# Patient Record
Sex: Female | Born: 1950 | Race: Black or African American | Hispanic: No | Marital: Single | Smoking: Never smoker
Health system: Southern US, Community
[De-identification: ages and names within clinical notes are randomized; demographics above are authoritative.]

## PROBLEM LIST (undated history)

## (undated) DIAGNOSIS — H269 Unspecified cataract: Secondary | ICD-10-CM

## (undated) DIAGNOSIS — E0500 Thyrotoxicosis with diffuse goiter without thyrotoxic crisis or storm: Secondary | ICD-10-CM

## (undated) DIAGNOSIS — M1990 Unspecified osteoarthritis, unspecified site: Secondary | ICD-10-CM

## (undated) DIAGNOSIS — E785 Hyperlipidemia, unspecified: Secondary | ICD-10-CM

## (undated) DIAGNOSIS — E079 Disorder of thyroid, unspecified: Secondary | ICD-10-CM

## (undated) DIAGNOSIS — E7800 Pure hypercholesterolemia, unspecified: Secondary | ICD-10-CM

## (undated) DIAGNOSIS — I10 Essential (primary) hypertension: Secondary | ICD-10-CM

## (undated) HISTORY — DX: Thyrotoxicosis with diffuse goiter without thyrotoxic crisis or storm: E05.00

## (undated) HISTORY — DX: Essential (primary) hypertension: I10

## (undated) HISTORY — DX: Hyperlipidemia, unspecified: E78.5

## (undated) HISTORY — DX: Disorder of thyroid, unspecified: E07.9

## (undated) HISTORY — PX: ABDOMINAL HYSTERECTOMY: SHX81

## (undated) HISTORY — DX: Unspecified osteoarthritis, unspecified site: M19.90

## (undated) HISTORY — DX: Unspecified cataract: H26.9

## (undated) HISTORY — DX: Pure hypercholesterolemia, unspecified: E78.00

---

## 1997-12-26 ENCOUNTER — Emergency Department (HOSPITAL_COMMUNITY): Admission: EM | Admit: 1997-12-26 | Discharge: 1997-12-26 | Payer: Self-pay | Admitting: Emergency Medicine

## 1998-01-18 ENCOUNTER — Other Ambulatory Visit: Admission: RE | Admit: 1998-01-18 | Discharge: 1998-01-18 | Payer: Self-pay | Admitting: Gynecology

## 1998-06-24 ENCOUNTER — Ambulatory Visit (HOSPITAL_COMMUNITY): Admission: RE | Admit: 1998-06-24 | Discharge: 1998-06-24 | Payer: Self-pay | Admitting: Diagnostic Radiology

## 1998-06-24 ENCOUNTER — Encounter: Payer: Self-pay | Admitting: Diagnostic Radiology

## 1998-07-22 ENCOUNTER — Encounter: Payer: Self-pay | Admitting: Gynecology

## 1998-07-26 ENCOUNTER — Encounter (INDEPENDENT_AMBULATORY_CARE_PROVIDER_SITE_OTHER): Payer: Self-pay | Admitting: Specialist

## 1998-07-26 ENCOUNTER — Inpatient Hospital Stay (HOSPITAL_COMMUNITY): Admission: RE | Admit: 1998-07-26 | Discharge: 1998-07-29 | Payer: Self-pay | Admitting: Gynecology

## 1999-04-20 ENCOUNTER — Other Ambulatory Visit: Admission: RE | Admit: 1999-04-20 | Discharge: 1999-04-20 | Payer: Self-pay | Admitting: Gynecology

## 2000-06-09 ENCOUNTER — Other Ambulatory Visit: Admission: RE | Admit: 2000-06-09 | Discharge: 2000-06-09 | Payer: Self-pay | Admitting: Gynecology

## 2000-06-21 ENCOUNTER — Encounter: Payer: Self-pay | Admitting: Gynecology

## 2000-06-21 ENCOUNTER — Encounter: Admission: RE | Admit: 2000-06-21 | Discharge: 2000-06-21 | Payer: Self-pay | Admitting: Gynecology

## 2001-06-26 ENCOUNTER — Encounter: Admission: RE | Admit: 2001-06-26 | Discharge: 2001-06-26 | Payer: Self-pay | Admitting: Gynecology

## 2001-06-26 ENCOUNTER — Encounter: Payer: Self-pay | Admitting: Gynecology

## 2001-06-26 ENCOUNTER — Other Ambulatory Visit: Admission: RE | Admit: 2001-06-26 | Discharge: 2001-06-26 | Payer: Self-pay | Admitting: Gynecology

## 2002-06-30 ENCOUNTER — Other Ambulatory Visit: Admission: RE | Admit: 2002-06-30 | Discharge: 2002-06-30 | Payer: Self-pay | Admitting: Gynecology

## 2002-07-01 ENCOUNTER — Encounter: Admission: RE | Admit: 2002-07-01 | Discharge: 2002-07-01 | Payer: Self-pay | Admitting: Gynecology

## 2002-07-01 ENCOUNTER — Encounter: Payer: Self-pay | Admitting: Gynecology

## 2003-07-07 ENCOUNTER — Encounter: Admission: RE | Admit: 2003-07-07 | Discharge: 2003-07-07 | Payer: Self-pay | Admitting: Gynecology

## 2003-07-09 ENCOUNTER — Other Ambulatory Visit: Admission: RE | Admit: 2003-07-09 | Discharge: 2003-07-09 | Payer: Self-pay | Admitting: Gynecology

## 2004-07-08 ENCOUNTER — Ambulatory Visit (HOSPITAL_COMMUNITY): Admission: RE | Admit: 2004-07-08 | Discharge: 2004-07-08 | Payer: Self-pay | Admitting: Gynecology

## 2004-07-19 ENCOUNTER — Other Ambulatory Visit: Admission: RE | Admit: 2004-07-19 | Discharge: 2004-07-19 | Payer: Self-pay | Admitting: Gynecology

## 2005-07-14 ENCOUNTER — Ambulatory Visit (HOSPITAL_COMMUNITY): Admission: RE | Admit: 2005-07-14 | Discharge: 2005-07-14 | Payer: Self-pay | Admitting: Gynecology

## 2005-07-24 ENCOUNTER — Other Ambulatory Visit: Admission: RE | Admit: 2005-07-24 | Discharge: 2005-07-24 | Payer: Self-pay | Admitting: Gynecology

## 2006-07-10 ENCOUNTER — Encounter: Admission: RE | Admit: 2006-07-10 | Discharge: 2006-07-10 | Payer: Self-pay | Admitting: Orthopedic Surgery

## 2006-07-16 ENCOUNTER — Ambulatory Visit (HOSPITAL_COMMUNITY): Admission: RE | Admit: 2006-07-16 | Discharge: 2006-07-16 | Payer: Self-pay | Admitting: Gynecology

## 2006-08-13 ENCOUNTER — Other Ambulatory Visit: Admission: RE | Admit: 2006-08-13 | Discharge: 2006-08-13 | Payer: Self-pay | Admitting: Gynecology

## 2007-07-17 ENCOUNTER — Ambulatory Visit (HOSPITAL_COMMUNITY): Admission: RE | Admit: 2007-07-17 | Discharge: 2007-07-17 | Payer: Self-pay | Admitting: Gynecology

## 2008-06-11 IMAGING — MG MM DIGITAL SCREENING BILAT
4 series · 4 of 4 positions shown · non-contrast
Comparison: Prior studies.

DG SCREEN MAMMOGRAM BILATERAL
Bilateral CC and MLO view(s) were taken.
Prior study comparison: July 08, 2004, bilateral screening mammogram.

DIGITAL SCREENING MAMMOGRAM WITH CAD:

[R CC]
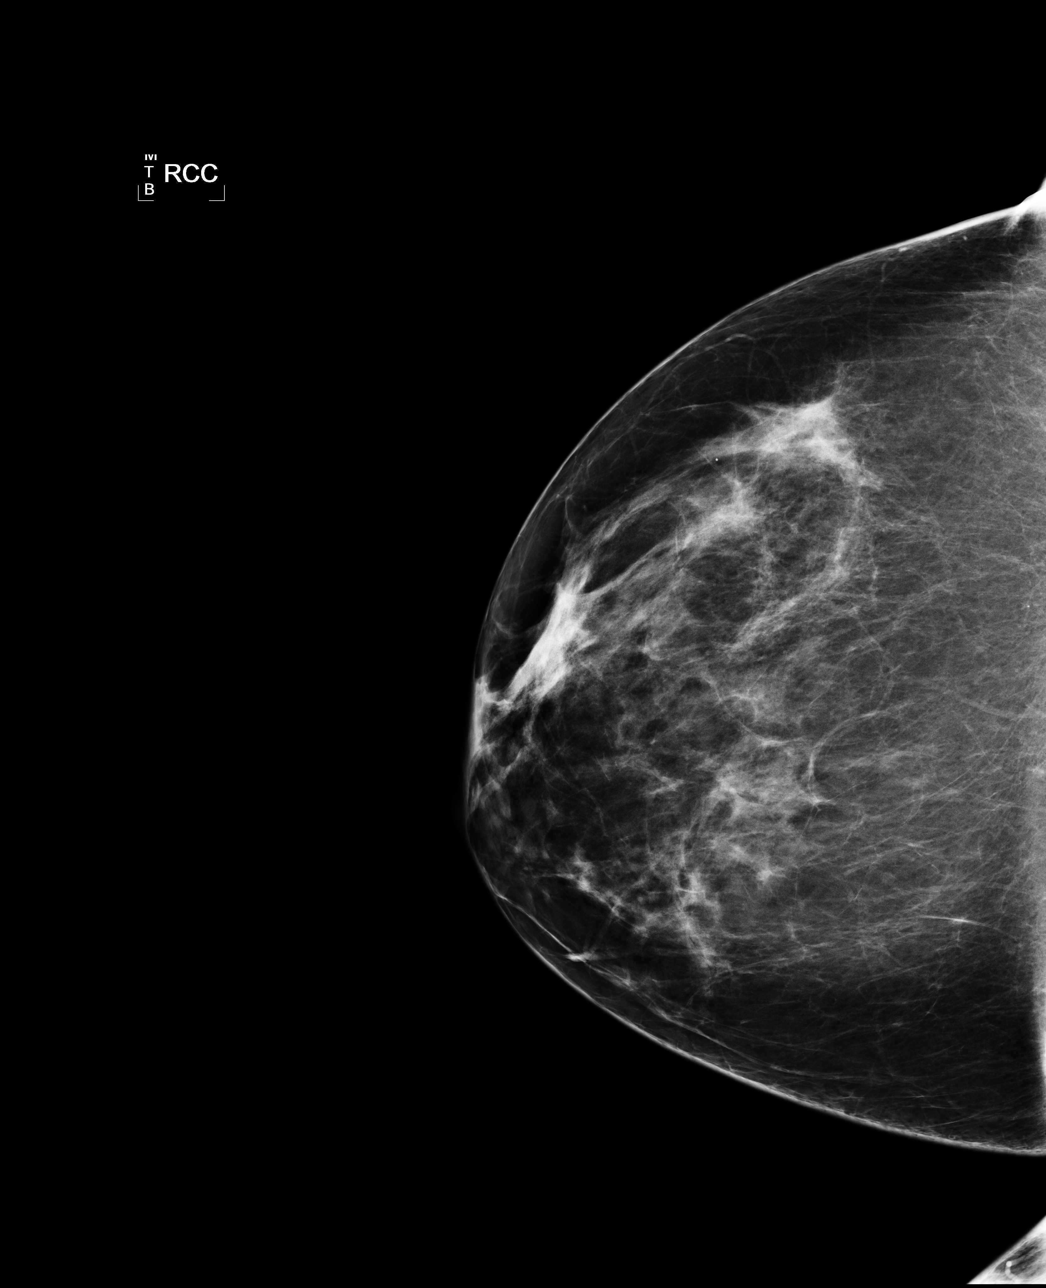

[R MLO]
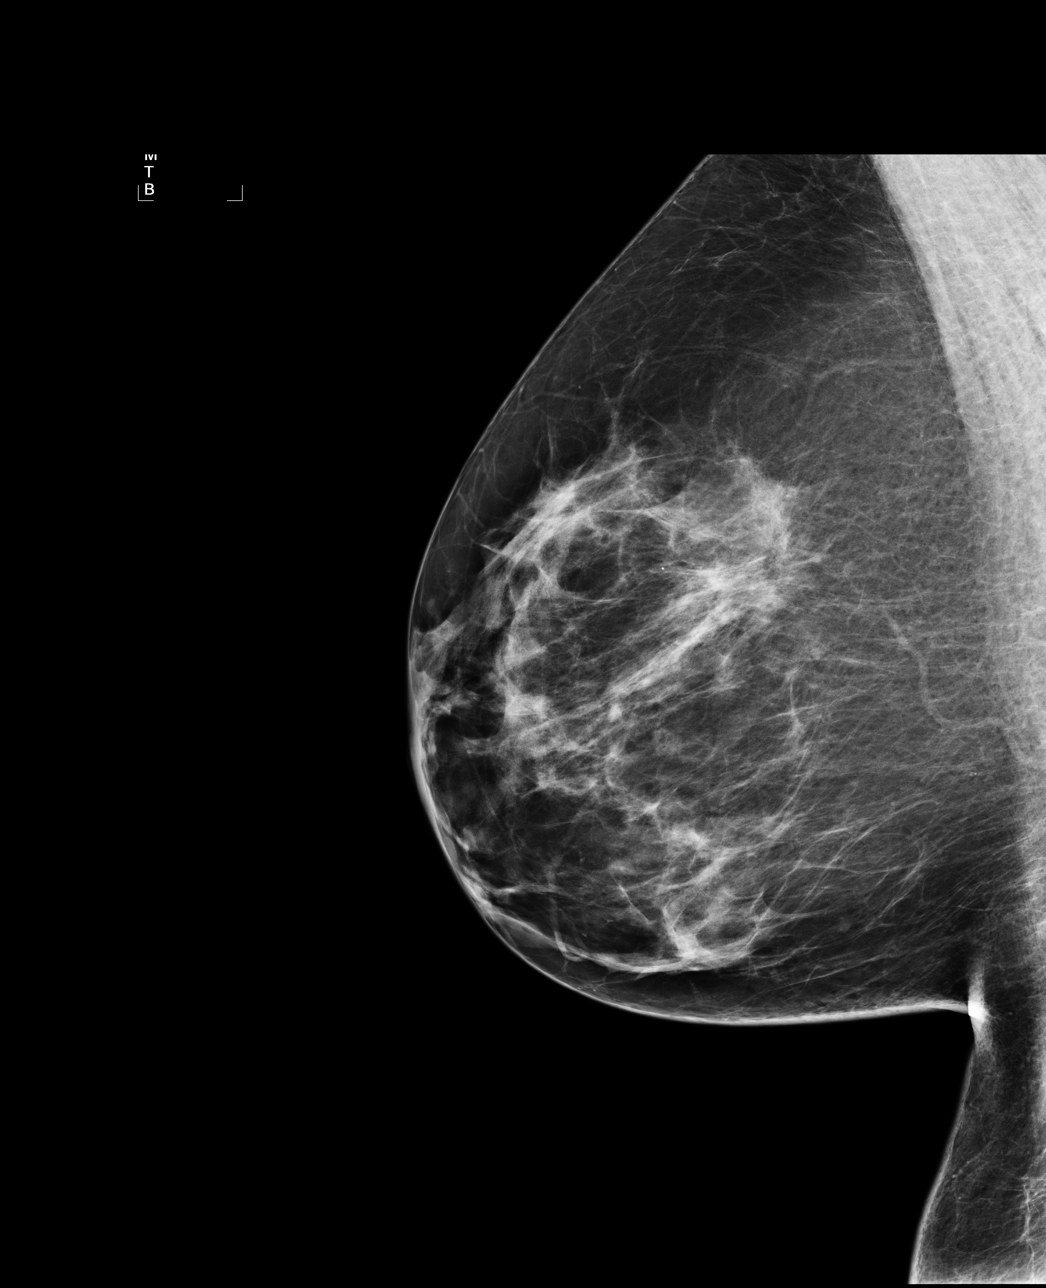

[L CC]
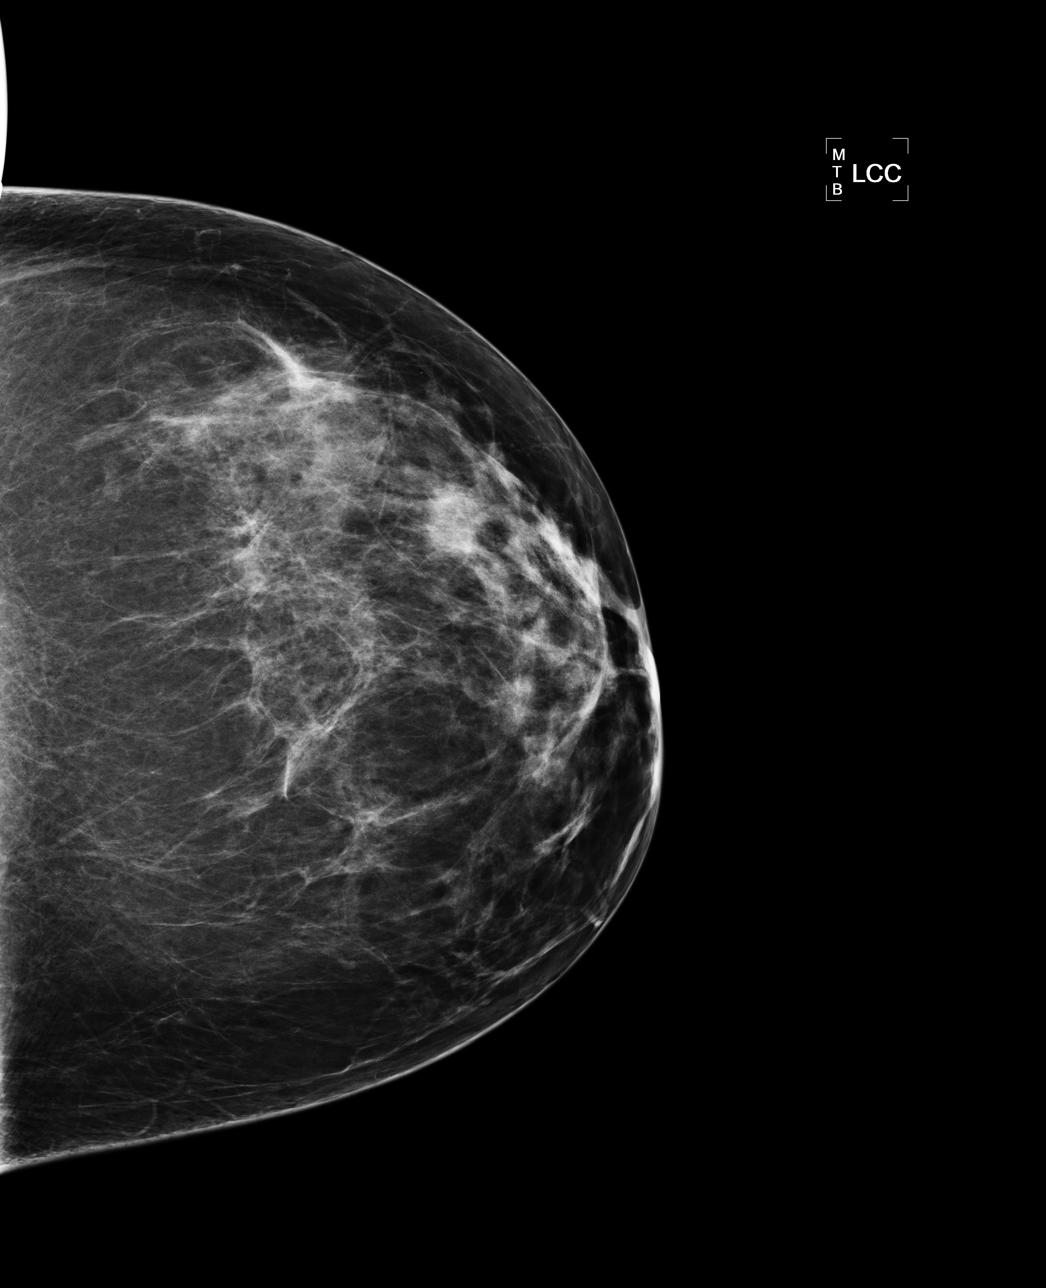

[L MLO]
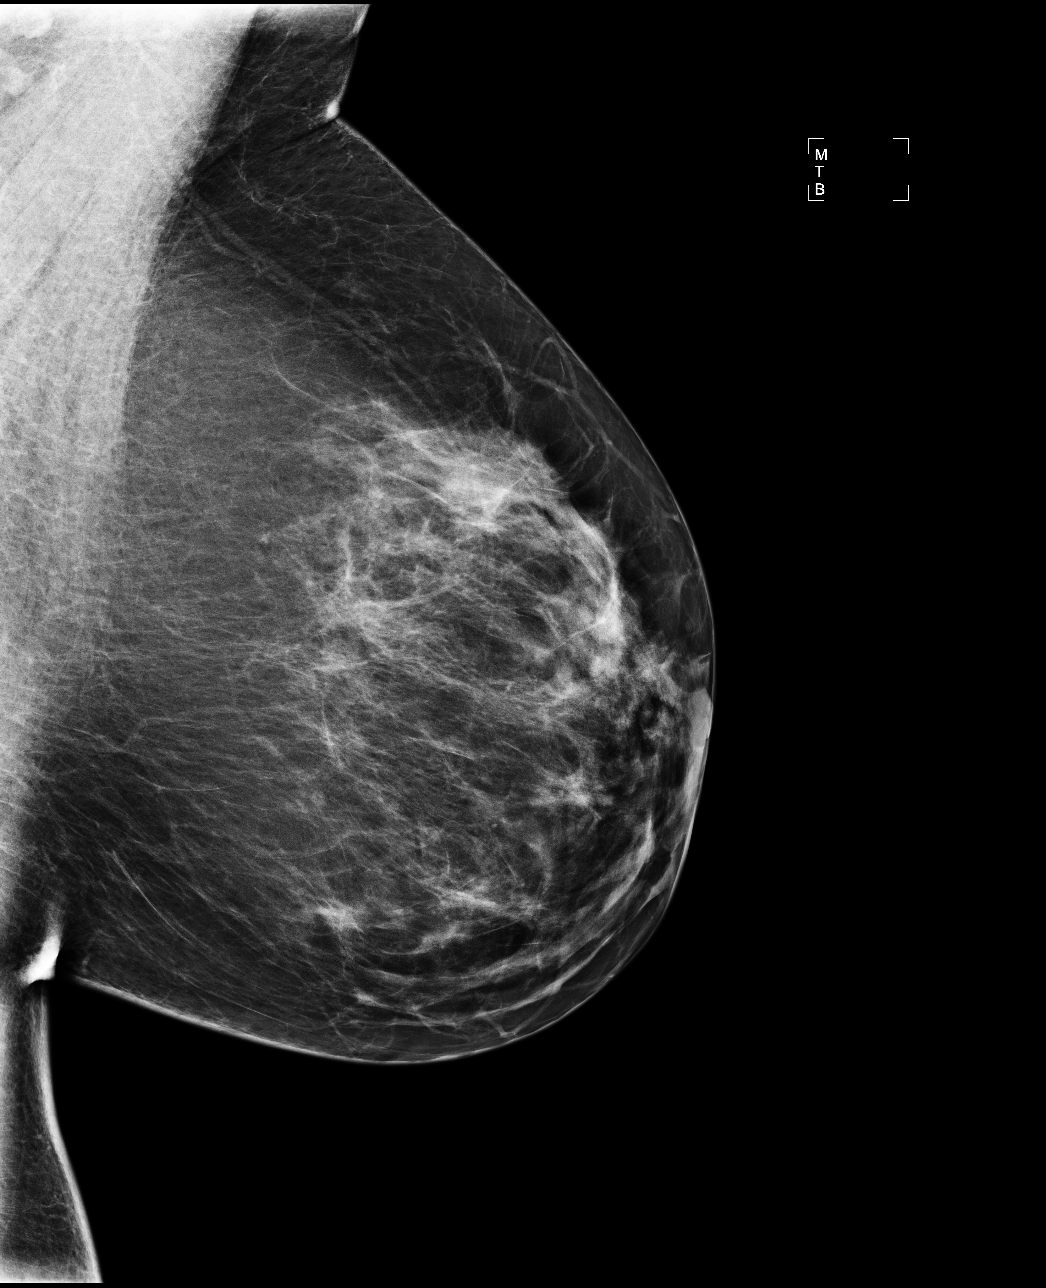

[4 of 4 positions shown; findings below may reference images not displayed]

There are scattered fibroglandular densities.  There is no dominant mass, architectural distortion 
or calcification to suggest malignancy.
IMPRESSION: No mammographic evidence of malignancy.  Suggest yearly screening mammography.

ASSESSMENT: Negative - BI-RADS 1

Screening mammogram in 1 year.
ANALYZED BY COMPUTER AIDED DETECTION. , THIS PROCEDURE WAS A DIGITAL MAMMOGRAM.

## 2008-07-17 ENCOUNTER — Ambulatory Visit (HOSPITAL_COMMUNITY): Admission: RE | Admit: 2008-07-17 | Discharge: 2008-07-17 | Payer: Self-pay | Admitting: Gynecology

## 2009-08-31 ENCOUNTER — Ambulatory Visit (HOSPITAL_COMMUNITY): Admission: RE | Admit: 2009-08-31 | Discharge: 2009-08-31 | Payer: Self-pay | Admitting: Gynecology

## 2010-01-30 ENCOUNTER — Encounter: Payer: Self-pay | Admitting: Gynecology

## 2011-08-03 ENCOUNTER — Other Ambulatory Visit (HOSPITAL_COMMUNITY): Payer: Self-pay | Admitting: Gynecology

## 2011-08-03 DIAGNOSIS — Z1231 Encounter for screening mammogram for malignant neoplasm of breast: Secondary | ICD-10-CM

## 2011-08-23 ENCOUNTER — Ambulatory Visit (HOSPITAL_COMMUNITY)
Admission: RE | Admit: 2011-08-23 | Discharge: 2011-08-23 | Disposition: A | Discharge status: Home / Self Care | Payer: BC Managed Care – PPO | Source: Ambulatory Visit | Attending: Gynecology | Admitting: Gynecology

## 2011-08-23 DIAGNOSIS — Z1231 Encounter for screening mammogram for malignant neoplasm of breast: Secondary | ICD-10-CM

## 2013-07-28 ENCOUNTER — Other Ambulatory Visit (HOSPITAL_COMMUNITY): Payer: Self-pay | Admitting: Family Medicine

## 2013-07-28 DIAGNOSIS — Z1231 Encounter for screening mammogram for malignant neoplasm of breast: Secondary | ICD-10-CM

## 2013-08-01 ENCOUNTER — Ambulatory Visit (HOSPITAL_COMMUNITY)
Admission: RE | Admit: 2013-08-01 | Discharge: 2013-08-01 | Disposition: A | Discharge status: Home / Self Care | Payer: BC Managed Care – PPO | Source: Ambulatory Visit | Attending: Family Medicine | Admitting: Family Medicine

## 2013-08-01 DIAGNOSIS — Z1231 Encounter for screening mammogram for malignant neoplasm of breast: Secondary | ICD-10-CM | POA: Insufficient documentation

## 2014-09-16 DIAGNOSIS — M25562 Pain in left knee: Secondary | ICD-10-CM | POA: Diagnosis not present

## 2014-09-16 DIAGNOSIS — G8929 Other chronic pain: Secondary | ICD-10-CM | POA: Diagnosis not present

## 2014-09-16 DIAGNOSIS — M25561 Pain in right knee: Secondary | ICD-10-CM | POA: Diagnosis not present

## 2014-09-16 DIAGNOSIS — M5412 Radiculopathy, cervical region: Secondary | ICD-10-CM | POA: Diagnosis not present

## 2014-11-09 DIAGNOSIS — Z23 Encounter for immunization: Secondary | ICD-10-CM | POA: Diagnosis not present

## 2014-11-09 DIAGNOSIS — E039 Hypothyroidism, unspecified: Secondary | ICD-10-CM | POA: Diagnosis not present

## 2014-11-09 DIAGNOSIS — E785 Hyperlipidemia, unspecified: Secondary | ICD-10-CM | POA: Diagnosis not present

## 2014-11-09 DIAGNOSIS — I10 Essential (primary) hypertension: Secondary | ICD-10-CM | POA: Diagnosis not present

## 2015-01-04 DIAGNOSIS — R05 Cough: Secondary | ICD-10-CM | POA: Diagnosis not present

## 2015-01-04 DIAGNOSIS — J0140 Acute pansinusitis, unspecified: Secondary | ICD-10-CM | POA: Diagnosis not present

## 2016-12-14 ENCOUNTER — Ambulatory Visit: Payer: Medicare Other | Admitting: Neurology

## 2016-12-14 ENCOUNTER — Encounter: Payer: Self-pay | Admitting: Neurology

## 2016-12-14 VITALS — BP 135/76 | HR 63 | Ht 69.0 in | Wt 170.2 lb

## 2016-12-14 DIAGNOSIS — R269 Unspecified abnormalities of gait and mobility: Secondary | ICD-10-CM | POA: Diagnosis not present

## 2016-12-14 DIAGNOSIS — R202 Paresthesia of skin: Secondary | ICD-10-CM | POA: Diagnosis not present

## 2016-12-14 DIAGNOSIS — M542 Cervicalgia: Secondary | ICD-10-CM | POA: Insufficient documentation

## 2016-12-14 MED ORDER — DULOXETINE HCL 60 MG PO CPEP: 60.0000 mg | ORAL_CAPSULE | Freq: Every day | ORAL | 12 refills | Status: AC

## 2016-12-14 NOTE — Progress Notes (Signed)
PATIENT: Carol GlazierVerna R Kerner DOB: Jun 19, 1950  Chief Complaint  Patient presents with  . Pain    Reports pain and weakness in bilateral legs and feet.  Also, has burning sensation in arms, left worse than right.   Marland Kitchen. PCP    Ronney LionHazy, Marian, NP     HISTORICAL  Carol GlazierVerna R Price is a 66 year old female, seen in refer by primary care nurse practitioner hazy, Armando ReichertMarian for evaluation of pain and weakness of bilateral lower extremity, initial evaluation was on December 14, 2016.  I reviewed, and summerzied referre note, medical history of Graves' disease, now hypothyroidism, supplement, hyperlipidemia, hypertension,  She reported chronic diffuse body achy pain, multiple joint pain, worse since 2013, she complains of low back pain, radiating pain to bilateral lower extremity, progressive worsening bilateral feet and distal leg paresthesia, also complains of morning stiffness, recent onset hand and upper extremity paresthesia,  She has urinary urgency since 2013, also getting worse,   REVIEW OF SYSTEMS: Full 14 system review of systems performed and notable only for as above  ALLERGIES: No Known Allergies  HOME MEDICATIONS: Current Outpatient Medications  Medication Sig Dispense Refill  . atenolol (TENORMIN) 25 MG tablet atenolol 25 mg tabs  1 tablet daily    . Carboxymethylcellulose Sodium (EYE DROPS OP) Place into both eyes daily.     . Cholecalciferol (VITAMIN D PO) Take by mouth daily.    Marland Kitchen. levothyroxine (SYNTHROID, LEVOTHROID) 75 MCG tablet levothyroxine sodium 75 mcg tabs  1 tablet daily    . losartan (COZAAR) 50 MG tablet losartan potassium 50 mg tabs  1/2 tablet every day    . Multiple Vitamins-Minerals (PRESERVISION AREDS 2+MULTI VIT PO) PreserVision AREDS  1 tablet daily    . potassium chloride (KLOR-CON M10) 10 MEQ tablet klor-con m10 10 meq tbcr  1 tablet daily    . simvastatin (ZOCOR) 20 MG tablet simvastatin 20 mg tablet  Take 1/2 tablet every other day.    .  triamterene-hydrochlorothiazide (MAXZIDE-25) 37.5-25 MG tablet triamt/hctz  tab 37.5-25  1 tablet daily     No current facility-administered medications for this visit.     PAST MEDICAL HISTORY: Past Medical History:  Diagnosis Date  . Graves disease   . Hypercholesteremia   . Hypertension     PAST SURGICAL HISTORY: Past Surgical History:  Procedure Laterality Date  . ABDOMINAL HYSTERECTOMY      FAMILY HISTORY: Family History  Problem Relation Age of Onset  . Sarcoidosis Mother   . Hypertension Mother   . Other Father        "brain hemmorhage"  . Prostate cancer Father   . Diabetes Father     SOCIAL HISTORY:  Social History   Socioeconomic History  . Marital status: Single    Spouse name: Not on file  . Number of children: 0  . Years of education: 7118  . Highest education level: Master's degree (e.g., MA, MS, MEng, MEd, MSW, MBA)  Social Needs  . Financial resource strain: Not on file  . Food insecurity - worry: Not on file  . Food insecurity - inability: Not on file  . Transportation needs - medical: Not on file  . Transportation needs - non-medical: Not on file  Occupational History  . Occupation: Retired  Tobacco Use  . Smoking status: Never Smoker  . Smokeless tobacco: Never Used  Substance and Sexual Activity  . Alcohol use: No    Frequency: Never  . Drug use: No  . Sexual activity:  Not on file  Other Topics Concern  . Not on file  Social History Narrative   Lives at home alone.   Right-handed.   Occasional soda.     PHYSICAL EXAM   Vitals:   12/14/16 0819  BP: 135/76  Pulse: 63  Weight: 170 lb 4 oz (77.2 kg)  Height: 5\' 9"  (1.753 m)    Not recorded      Body mass index is 25.14 kg/m.  PHYSICAL EXAMNIATION:  Gen: NAD, conversant, well nourised, obese, well groomed                     Cardiovascular: Regular rate rhythm, no peripheral edema, warm, nontender. Eyes: Conjunctivae clear without exudates or hemorrhage Neck: Supple,  no carotid bruits. Pulmonary: Clear to auscultation bilaterally   NEUROLOGICAL EXAM:  MENTAL STATUS: Speech:    Speech is normal; fluent and spontaneous with normal comprehension.  Cognition:     Orientation to time, place and person     Normal recent and remote memory     Normal Attention span and concentration     Normal Language, naming, repeating,spontaneous speech     Fund of knowledge   CRANIAL NERVES: CN II: Visual fields are full to confrontation. Fundoscopic exam is normal with sharp discs and no vascular changes. Pupils are round equal and briskly reactive to light. CN III, IV, VI: extraocular movement are normal. No ptosis. CN V: Facial sensation is intact to pinprick in all 3 divisions bilaterally. Corneal responses are intact.  CN VII: Face is symmetric with normal eye closure and smile. CN VIII: Hearing is normal to rubbing fingers CN IX, X: Palate elevates symmetrically. Phonation is normal. CN XI: Head turning and shoulder shrug are intact CN XII: Tongue is midline with normal movements and no atrophy.  MOTOR: There is no pronator drift of out-stretched arms. Muscle bulk and tone are normal. Muscle strength is normal.  REFLEXES: Reflexes are 2+ and symmetric at the biceps, triceps, knees, and ankles. Plantar responses are flexor.  SENSORY: Intact to light touch, pinprick, positional sensation and vibratory sensation are intact in fingers and toes.  COORDINATION: Rapid alternating movements and fine finger movements are intact. There is no dysmetria on finger-to-nose and heel-knee-shin.    GAIT/STANCE: She needs to up to get up from seated position, steady, mild difficulty with tandem walking, Romberg is absent.   DIAGNOSTIC DATA (LABS, IMAGING, TESTING) - I reviewed patient records, labs, notes, testing and imaging myself where available.   ASSESSMENT AND PLAN  Carol GlazierVerna R Price is a 66 y.o. female   Constellation of complaints, including 4 extremity  paresthesia, stiff gait,  Laboratory evaluation to rule out inflammatory process  MRI cervical spine rule out cervical myelopathy  EMG nerve conduction study for possible neuropathy.  Cymbalta 60 mg daily    Levert FeinsteinYijun Dhalia Zingaro, M.D. Ph.D.  [facility] [address], [address] [place], [place] [address] Ph: [phone] Fax: [phone]  CC: Ronney LionHazy, Marian, NP

## 2016-12-17 ENCOUNTER — Telehealth: Payer: Self-pay | Admitting: Neurology

## 2016-12-17 NOTE — Telephone Encounter (Signed)
Spoke to patient - she is aware of the results and agreeable to the recommendations below (diet control, exercise and staying well hydrated).

## 2016-12-17 NOTE — Telephone Encounter (Signed)
Please call patient, laboratory evaluation showed elevated A1c 6.3, indicating elevated glucose level in the past 3 months, she should start diet control, exercise,  There was also mild elevated creatinine, she should keep well hydration,  The rest of the laboratory evaluation showed no significant abnormality.

## 2016-12-18 LAB — CBC WITH DIFFERENTIAL/PLATELET
BASOS: 1 %
Basophils Absolute: 0.1 10*3/uL (ref 0.0–0.2)
EOS (ABSOLUTE): 0.1 10*3/uL (ref 0.0–0.4)
EOS: 2 %
HEMATOCRIT: 42.8 % (ref 34.0–46.6)
Hemoglobin: 14.1 g/dL (ref 11.1–15.9)
IMMATURE GRANS (ABS): 0 10*3/uL (ref 0.0–0.1)
IMMATURE GRANULOCYTES: 0 %
LYMPHS: 27 %
Lymphocytes Absolute: 1.1 10*3/uL (ref 0.7–3.1)
MCH: 30.1 pg (ref 26.6–33.0)
MCHC: 32.9 g/dL (ref 31.5–35.7)
MCV: 91 fL (ref 79–97)
MONOCYTES: 7 %
MONOS ABS: 0.3 10*3/uL (ref 0.1–0.9)
NEUTROS PCT: 63 %
Neutrophils Absolute: 2.5 10*3/uL (ref 1.4–7.0)
Platelets: 199 10*3/uL (ref 150–379)
RBC: 4.69 x10E6/uL (ref 3.77–5.28)
RDW: 13.8 % (ref 12.3–15.4)
WBC: 4 10*3/uL (ref 3.4–10.8)

## 2016-12-18 LAB — RPR: RPR Ser Ql: NONREACTIVE

## 2016-12-18 LAB — COMPREHENSIVE METABOLIC PANEL
A/G RATIO: 1.6 (ref 1.2–2.2)
ALBUMIN: 4.4 g/dL (ref 3.6–4.8)
ALT: 12 IU/L (ref 0–32)
AST: 17 IU/L (ref 0–40)
Alkaline Phosphatase: 96 IU/L (ref 39–117)
BUN / CREAT RATIO: 18 (ref 12–28)
BUN: 19 mg/dL (ref 8–27)
Bilirubin Total: 0.4 mg/dL (ref 0.0–1.2)
CALCIUM: 10.2 mg/dL (ref 8.7–10.3)
CO2: 25 mmol/L (ref 20–29)
Chloride: 102 mmol/L (ref 96–106)
Creatinine, Ser: 1.05 mg/dL — ABNORMAL HIGH (ref 0.57–1.00)
GFR, EST AFRICAN AMERICAN: 64 mL/min/{1.73_m2} (ref >59)
GFR, EST NON AFRICAN AMERICAN: 55 mL/min/{1.73_m2} — AB (ref >59)
Globulin, Total: 2.7 g/dL (ref 1.5–4.5)
Glucose: 107 mg/dL — ABNORMAL HIGH (ref 65–99)
POTASSIUM: 4.2 mmol/L (ref 3.5–5.2)
Sodium: 142 mmol/L (ref 134–144)

## 2016-12-18 LAB — SEDIMENTATION RATE: Sed Rate: 29 mm/hr (ref 0–40)

## 2016-12-18 LAB — HGB A1C W/O EAG: Hgb A1c MFr Bld: 6.3 % — ABNORMAL HIGH (ref 4.8–5.6)

## 2016-12-18 LAB — TSH: TSH: 3.3 u[IU]/mL (ref 0.450–4.500)

## 2016-12-18 LAB — VITAMIN B12: Vitamin B-12: 472 pg/mL (ref 232–1245)

## 2016-12-18 LAB — CK: CK TOTAL: 67 U/L (ref 24–173)

## 2016-12-18 LAB — C-REACTIVE PROTEIN: CRP: 0.3 mg/L (ref 0.0–4.9)

## 2016-12-18 LAB — ANA W/REFLEX IF POSITIVE: Anti Nuclear Antibody(ANA): NEGATIVE

## 2016-12-18 LAB — IMMUNOFIXATION ELECTROPHORESIS
IGM (IMMUNOGLOBULIN M), SRM: 104 mg/dL (ref 26–217)
IgA/Immunoglobulin A, Serum: 117 mg/dL (ref 87–352)
IgG (Immunoglobin G), Serum: 1187 mg/dL (ref 700–1600)
TOTAL PROTEIN: 7.1 g/dL (ref 6.0–8.5)

## 2016-12-18 LAB — FOLATE: FOLATE: 12.8 ng/mL (ref >3.0)

## 2016-12-18 LAB — RHEUMATOID FACTOR: Rhuematoid fact SerPl-aCnc: <10 IU/mL (ref 0.0–13.9)

## 2016-12-20 ENCOUNTER — Telehealth: Payer: Self-pay | Admitting: Neurology

## 2016-12-20 NOTE — Telephone Encounter (Signed)
error 

## 2017-01-19 ENCOUNTER — Encounter: Payer: Self-pay | Admitting: Neurology

## 2017-01-19 ENCOUNTER — Ambulatory Visit (INDEPENDENT_AMBULATORY_CARE_PROVIDER_SITE_OTHER): Payer: Medicare Other | Admitting: Neurology

## 2017-01-19 DIAGNOSIS — R269 Unspecified abnormalities of gait and mobility: Secondary | ICD-10-CM

## 2017-01-19 DIAGNOSIS — M542 Cervicalgia: Secondary | ICD-10-CM

## 2017-01-19 DIAGNOSIS — R202 Paresthesia of skin: Secondary | ICD-10-CM

## 2017-01-19 DIAGNOSIS — Z0289 Encounter for other administrative examinations: Secondary | ICD-10-CM

## 2017-01-19 NOTE — Progress Notes (Signed)
PATIENT: Carol Price DOB: 01-22-1950  No chief complaint on file.    HISTORICAL  Carol Price is a 67 year old female, seen in refer by primary care nurse practitioner hazy, Jinny Sanders for evaluation of pain and weakness of bilateral lower extremity, initial evaluation was on December 14, 2016.  I reviewed, and summerzied referre note, medical history of Graves' disease, now hypothyroidism, supplement, hyperlipidemia, hypertension,  She reported chronic diffuse body achy pain, multiple joint pain, worse since 2013, she complains of low back pain, radiating pain to bilateral lower extremity, progressive worsening bilateral feet and distal leg paresthesia, also complains of morning stiffness, recent onset hand and upper extremity paresthesia,  She has urinary urgency since 2013, also getting worse,  UPDATE Jan 19 2017: Laboratory evaluation showed mild elevated creatinine 1.05, normal rheumatoid factor, CBC, ANA, ESR, protein electrophoresis, CPK, TSH, folic acid, RPR, C-reactive protein, vitamin B12,A1c was elevated 6.3  She continue complains of diffuse body achy pain,  REVIEW OF SYSTEMS: Full 14 system review of systems performed and notable only for as above  ALLERGIES: No Known Allergies  HOME MEDICATIONS: Current Outpatient Medications  Medication Sig Dispense Refill  . atenolol (TENORMIN) 25 MG tablet atenolol 25 mg tabs  1 tablet daily    . Carboxymethylcellulose Sodium (EYE DROPS OP) Place into both eyes daily.     . Cholecalciferol (VITAMIN D PO) Take by mouth daily.    . DULoxetine (CYMBALTA) 60 MG capsule Take 1 capsule (60 mg total) by mouth daily. 30 capsule 12  . levothyroxine (SYNTHROID, LEVOTHROID) 75 MCG tablet levothyroxine sodium 75 mcg tabs  1 tablet daily    . losartan (COZAAR) 50 MG tablet losartan potassium 50 mg tabs  1/2 tablet every day    . Multiple Vitamins-Minerals (PRESERVISION AREDS 2+MULTI VIT PO) PreserVision AREDS  1 tablet daily    .  potassium chloride (KLOR-CON M10) 10 MEQ tablet klor-con m10 10 meq tbcr  1 tablet daily    . simvastatin (ZOCOR) 20 MG tablet simvastatin 20 mg tablet  Take 1/2 tablet every other day.    . triamterene-hydrochlorothiazide (MAXZIDE-25) 37.5-25 MG tablet triamt/hctz  tab 37.5-25  1 tablet daily     No current facility-administered medications for this visit.     PAST MEDICAL HISTORY: Past Medical History:  Diagnosis Date  . Graves disease   . Hypercholesteremia   . Hypertension     PAST SURGICAL HISTORY: Past Surgical History:  Procedure Laterality Date  . ABDOMINAL HYSTERECTOMY      FAMILY HISTORY: Family History  Problem Relation Age of Onset  . Sarcoidosis Mother   . Hypertension Mother   . Other Father        "brain hemmorhage"  . Prostate cancer Father   . Diabetes Father     SOCIAL HISTORY:  Social History   Socioeconomic History  . Marital status: Single    Spouse name: Not on file  . Number of children: 0  . Years of education: 8  . Highest education level: Master's degree (e.g., MA, MS, MEng, MEd, MSW, MBA)  Social Needs  . Financial resource strain: Not on file  . Food insecurity - worry: Not on file  . Food insecurity - inability: Not on file  . Transportation needs - medical: Not on file  . Transportation needs - non-medical: Not on file  Occupational History  . Occupation: Retired  Tobacco Use  . Smoking status: Never Smoker  . Smokeless tobacco: Never Used  Substance and  Sexual Activity  . Alcohol use: No    Frequency: Never  . Drug use: No  . Sexual activity: Not on file  Other Topics Concern  . Not on file  Social History Narrative   Lives at home alone.   Right-handed.   Occasional soda.     PHYSICAL EXAM   There were no vitals filed for this visit.  Not recorded      There is no height or weight on file to calculate BMI.  PHYSICAL EXAMNIATION:  Gen: NAD, conversant, well nourised, obese, well groomed                      Cardiovascular: Regular rate rhythm, no peripheral edema, warm, nontender. Eyes: Conjunctivae clear without exudates or hemorrhage Neck: Supple, no carotid bruits. Pulmonary: Clear to auscultation bilaterally   NEUROLOGICAL EXAM:  MENTAL STATUS: Speech:    Speech is normal; fluent and spontaneous with normal comprehension.  Cognition:     Orientation to time, place and person     Normal recent and remote memory     Normal Attention span and concentration     Normal Language, naming, repeating,spontaneous speech     Fund of knowledge   CRANIAL NERVES: CN II: Visual fields are full to confrontation. Fundoscopic exam is normal with sharp discs and no vascular changes. Pupils are round equal and briskly reactive to light. CN III, IV, VI: extraocular movement are normal. No ptosis. CN V: Facial sensation is intact to pinprick in all 3 divisions bilaterally. Corneal responses are intact.  CN VII: Face is symmetric with normal eye closure and smile. CN VIII: Hearing is normal to rubbing fingers CN IX, X: Palate elevates symmetrically. Phonation is normal. CN XI: Head turning and shoulder shrug are intact CN XII: Tongue is midline with normal movements and no atrophy.  MOTOR: There is no pronator drift of out-stretched arms. Muscle bulk and tone are normal. Muscle strength is normal.  REFLEXES: Reflexes are 2+ and symmetric at the biceps, triceps, knees, and ankles. Plantar responses are flexor.  SENSORY: Intact to light touch, pinprick, positional sensation and vibratory sensation are intact in fingers and toes.  COORDINATION: Rapid alternating movements and fine finger movements are intact. There is no dysmetria on finger-to-nose and heel-knee-shin.    GAIT/STANCE: She needs to up to get up from seated position, steady, mild difficulty with tandem walking, Romberg is absent.   DIAGNOSTIC DATA (LABS, IMAGING, TESTING) - I reviewed patient records, labs, notes, testing and  imaging myself where available.   ASSESSMENT AND PLAN  Carol Price is a 67 y.o. female   Constellation of complaints, including 4 extremity paresthesia, stiff gait,  Extensive laboratory evaluation failed to demonstrate etiology,  Mild elevated A1c 6.3, I have advised her diet-controlled, exercise  MRI cervical spine rule out cervical myelopathy is pending  I have given her a prescription of Cymbalta 60 mg daily      Marcial Pacas, M.D. Ph.D.  [facility] [address] [place], [place] [address] Ph: [phone] Fax: [phone]  CC: Levin Bacon, NP

## 2017-01-19 NOTE — Procedures (Signed)
Full Name: Carol Price Gender: Female MRN #: [id number] Date of Birth: 25-Jan-2050    Visit Date: 01/19/17 11:03 Age: [age] Years 7211 Months Old Examining Physician: Levert FeinsteinYijun Dannel Rafter, MD  Referring Physician: Terrace ArabiaYan, MD History: 67 year old female complains of diffuse body achy pain  Summary of the tests:  Nerve conduction study: Right sural, superficial peroneal sensory responses were normal.  Right peroneal to EDB and tibial motor responses were normal. Right median, ulnar sensory and motor responses were normal.  Electromyography:  Selective needle examination of right upper, lower extremity muscles, right cervical, right lumbosacral paraspinal muscles   Conclusion: This is a normal study.  There is no electrodiagnostic evidence of inflammatory myopathy, right cervical radiculopathy, or right lumbosacral radiculopathy.    ------------------------------- Levert FeinsteinYIjun Threasa Kinch, M.D.  [facility] [address] [place], [place] [address] Tel: [phone] Fax: [phone]        [facility]    Nerve / Sites Muscle Latency Ref. Amplitude Ref. Rel Amp Segments Distance Velocity Ref. Area    ms ms mV mV %  cm m/s m/s mVms  R Median - APB     Wrist APB 4.1 ?4.4 9.9 ?4.0 100 Wrist - APB 7   35.7     Upper arm APB 9.1  8.3  84.2 Upper arm - Wrist 25 50 ?49 32.3  R Ulnar - ADM     Wrist ADM 3.2 ?3.3 10.6 ?6.0 100 Wrist - ADM 7   36.3     B.Elbow ADM 7.4  10.2  96.2 B.Elbow - Wrist 21 50 ?49 40.8     A.Elbow ADM 9.3  9.8  96 A.Elbow - B.Elbow 10 52 ?49 39.1         A.Elbow - Wrist      R Peroneal - EDB     Ankle EDB 4.6 ?6.5 5.8 ?2.0 100 Ankle - EDB 9   23.3     Fib head EDB 11.1  5.5  94.9 Fib head - Ankle 31 48 ?44 22.9     Pop fossa EDB 13.0  4.5  81.1 Pop fossa - Fib head 10 53 ?44 22.5         Pop fossa - Ankle      R Tibial - AH     Ankle AH 5.0 ?5.8 16.3 ?4.0 100 Ankle - AH 9   44.2     Pop fossa AH 13.6  12.9  79.5 Pop fossa - Ankle 37 43 ?41 40.9             SNC      Nerve / Sites Rec. Site Peak Lat Ref.  Amp Ref. Segments Distance Peak Diff Ref.    ms ms V V  cm ms ms  R Sural - Ankle (Calf)     Calf Ankle 4.1 ?4.4 6 ?6 Calf - Ankle 14    R Superficial peroneal - Ankle     Lat leg Ankle 4.3 ?4.4 6 ?6 Lat leg - Ankle 14    R Median, Ulnar - Transcarpal comparison     Median Palm Wrist 2.1 ?2.2 43 ?35 Median Palm - Wrist 8       Ulnar Palm Wrist 2.2 ?2.2 21 ?12 Ulnar Palm - Wrist 8          Median Palm - Ulnar Palm  -0.1 ?0.4  R Median - Orthodromic (Dig II, Mid palm)     Dig II Wrist 3.1 ?3.4 22 ?10 Dig  II - Wrist 13    R Ulnar - Orthodromic, (Dig V, Mid palm)     Dig V Wrist 2.7 ?3.1 20 ?5 Dig V - Wrist 21                 F  Wave    Nerve F Lat Ref.   ms ms  R Tibial - AH 46.3 ?56.0  R Ulnar - ADM 29.3 ?32.0         EMG full       EMG Summary Table    Spontaneous MUAP Recruitment  Muscle IA Fib PSW Fasc Other Amp Dur. Poly Pattern  R. First dorsal interosseous Normal None None None _______ Normal Normal Normal Normal  R. Pronator teres Normal None None None _______ Normal Normal Normal Normal  R. Deltoid Normal None None None _______ Normal Normal Normal Normal  R. Biceps brachii Normal None None None _______ Normal Normal Normal Normal  R. Triceps brachii Normal None None None _______ Normal Normal Normal Normal  R. Extensor digitorum communis Normal None None None _______ Normal Normal Normal Normal  R. Peroneus longus Normal None None None _______ Normal Normal Normal Normal  R. Tibialis anterior Normal None None None _______ Normal Normal Normal Normal  R. Tibialis posterior Normal None None None _______ Normal Normal Normal Normal  R. Gastrocnemius (Medial head) Normal None None None _______ Normal Normal Normal Normal  R. Biceps femoris (long head) Normal None None None _______ Normal Normal Normal Normal  R. Lumbar paraspinals (mid) Normal None None None _______ Normal Normal Normal Normal  R. Lumbar paraspinals (low) Normal  None None None _______ Normal Normal Normal Normal  R. Cervical paraspinals Normal None None None _______ Normal Normal Normal Normal

## 2017-04-05 ENCOUNTER — Other Ambulatory Visit: Payer: Self-pay | Admitting: Family Medicine

## 2017-04-05 DIAGNOSIS — Z1231 Encounter for screening mammogram for malignant neoplasm of breast: Secondary | ICD-10-CM

## 2017-05-03 ENCOUNTER — Ambulatory Visit
Admission: RE | Admit: 2017-05-03 | Discharge: 2017-05-03 | Disposition: A | Discharge status: Home / Self Care | Payer: Medicare Other | Source: Ambulatory Visit | Attending: Family Medicine | Admitting: Family Medicine

## 2017-05-03 DIAGNOSIS — Z1231 Encounter for screening mammogram for malignant neoplasm of breast: Secondary | ICD-10-CM

## 2018-06-30 ENCOUNTER — Other Ambulatory Visit: Payer: Self-pay | Admitting: *Deleted

## 2018-06-30 DIAGNOSIS — Z20822 Contact with and (suspected) exposure to covid-19: Secondary | ICD-10-CM

## 2018-07-07 LAB — NOVEL CORONAVIRUS, NAA: SARS-CoV-2, NAA: NOT DETECTED

## 2018-11-01 ENCOUNTER — Other Ambulatory Visit: Payer: Self-pay | Admitting: Family Medicine

## 2018-11-01 DIAGNOSIS — Z1231 Encounter for screening mammogram for malignant neoplasm of breast: Secondary | ICD-10-CM

## 2018-12-24 ENCOUNTER — Other Ambulatory Visit: Payer: Self-pay

## 2018-12-24 ENCOUNTER — Ambulatory Visit
Admission: RE | Admit: 2018-12-24 | Discharge: 2018-12-24 | Disposition: A | Discharge status: Home / Self Care | Payer: Medicare Other | Source: Ambulatory Visit | Attending: Family Medicine | Admitting: Family Medicine

## 2018-12-24 DIAGNOSIS — Z1231 Encounter for screening mammogram for malignant neoplasm of breast: Secondary | ICD-10-CM

## 2019-03-30 IMAGING — MG DIGITAL SCREENING BILATERAL MAMMOGRAM WITH TOMO AND CAD
8 series · 8 of 24 positions shown · non-contrast
Comparison: Previous exam(s).

CLINICAL DATA: Screening.

EXAM:
DIGITAL SCREENING BILATERAL MAMMOGRAM WITH TOMO AND CAD

[L CC synth-2D]
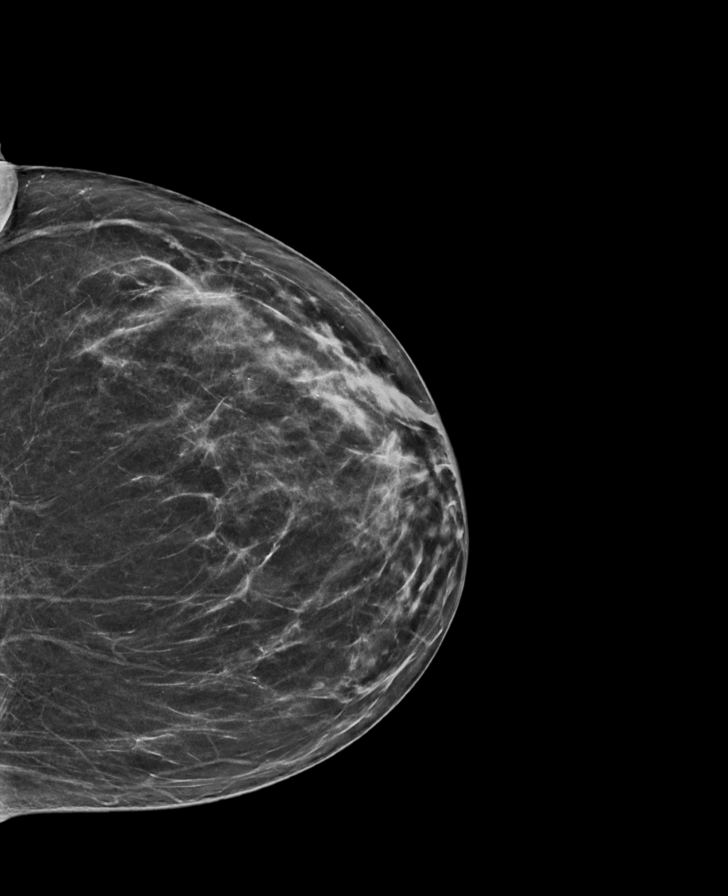

[L MLO synth-2D]
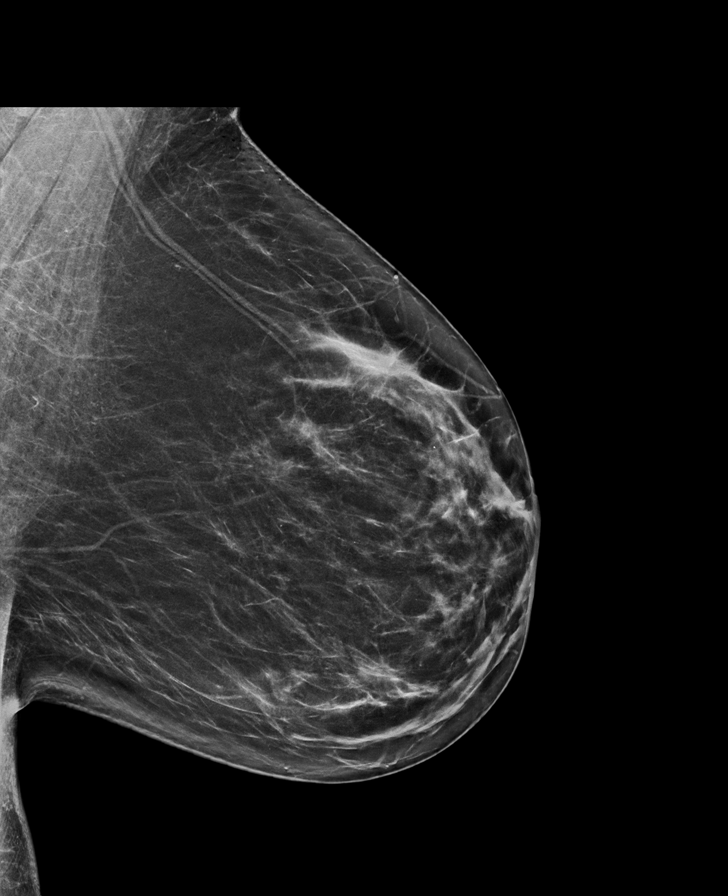

[R MLO synth-2D]
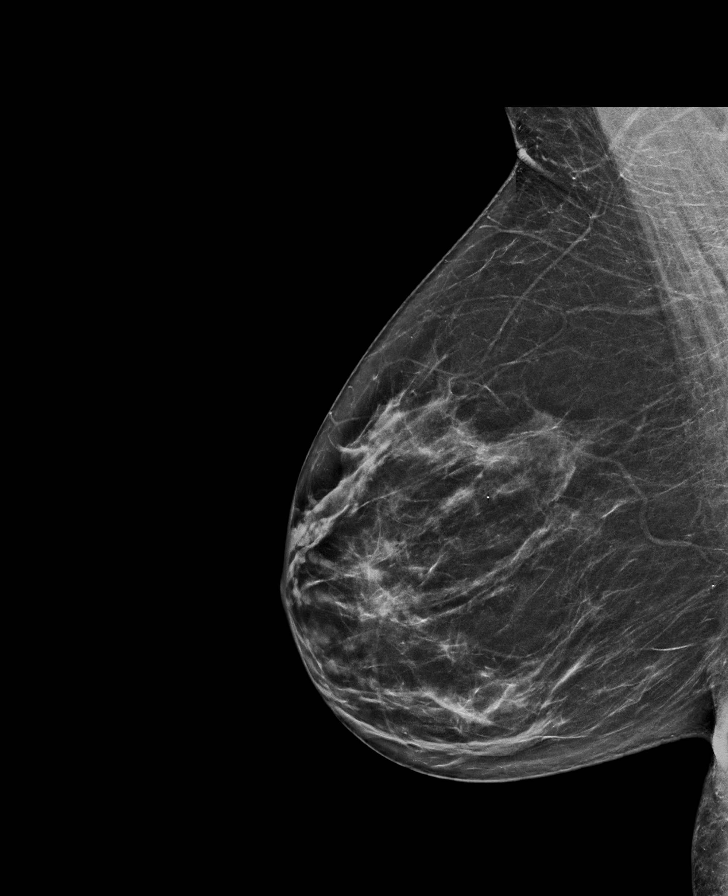

[R CC synth-2D]
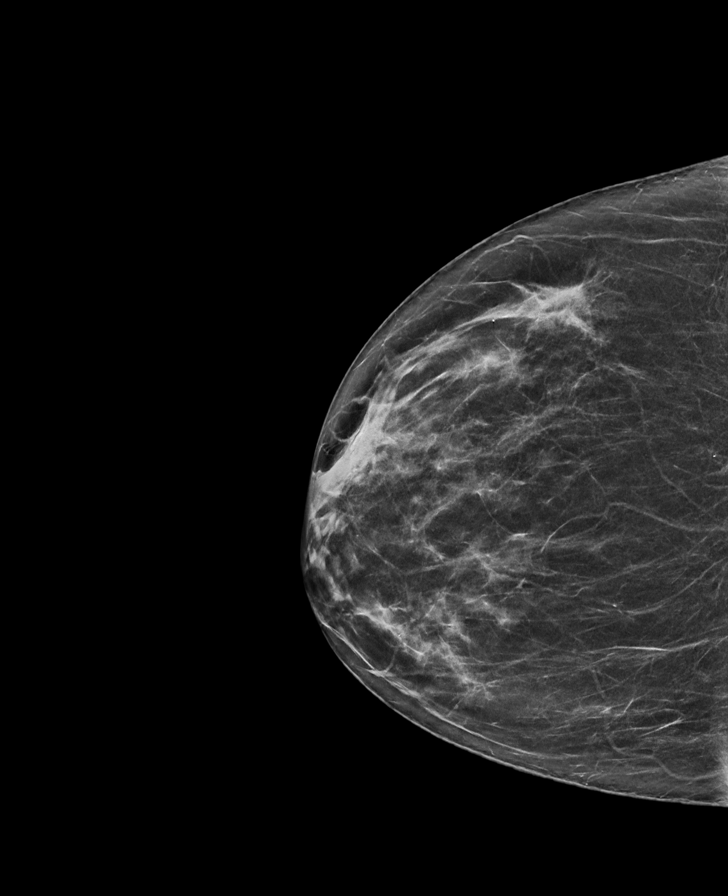

[R CC tomo · tomo slice 35/68.0]
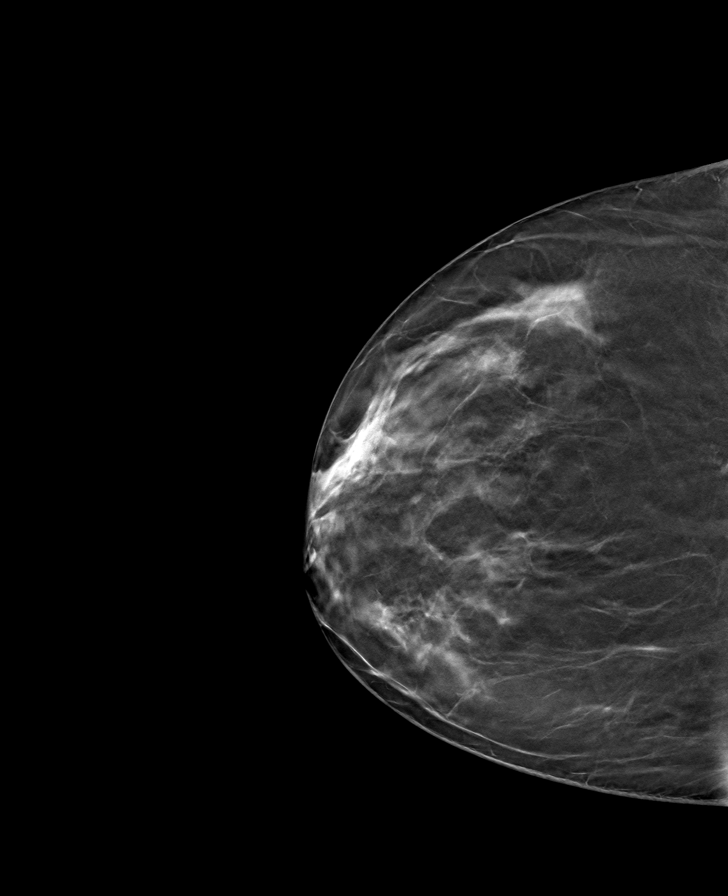

[R MLO tomo · tomo slice 35/70.0]
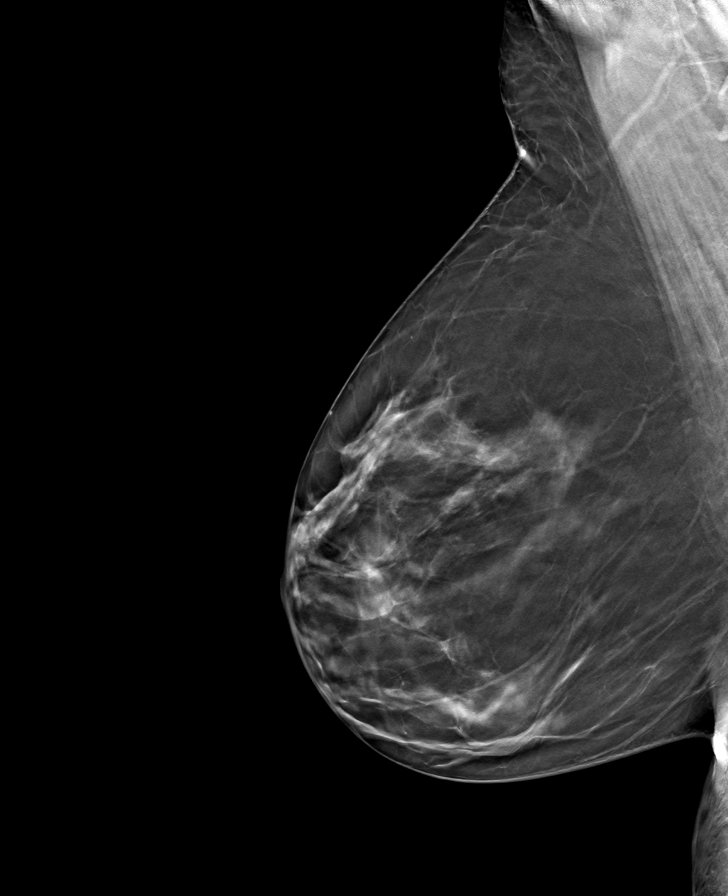

[L MLO tomo · tomo slice 38/75.0]
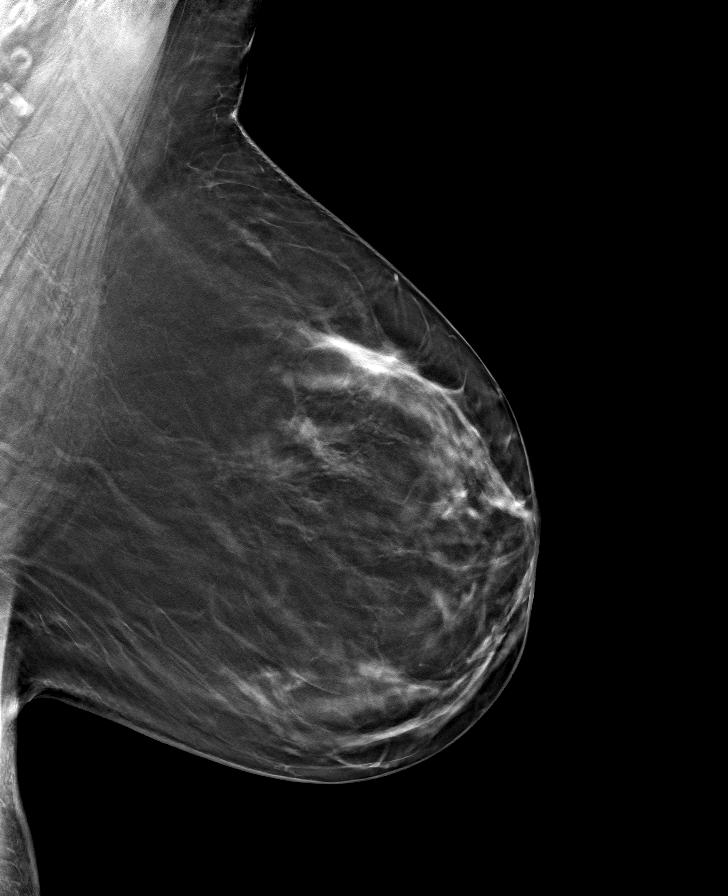

[L CC tomo · tomo slice 35/70.0]
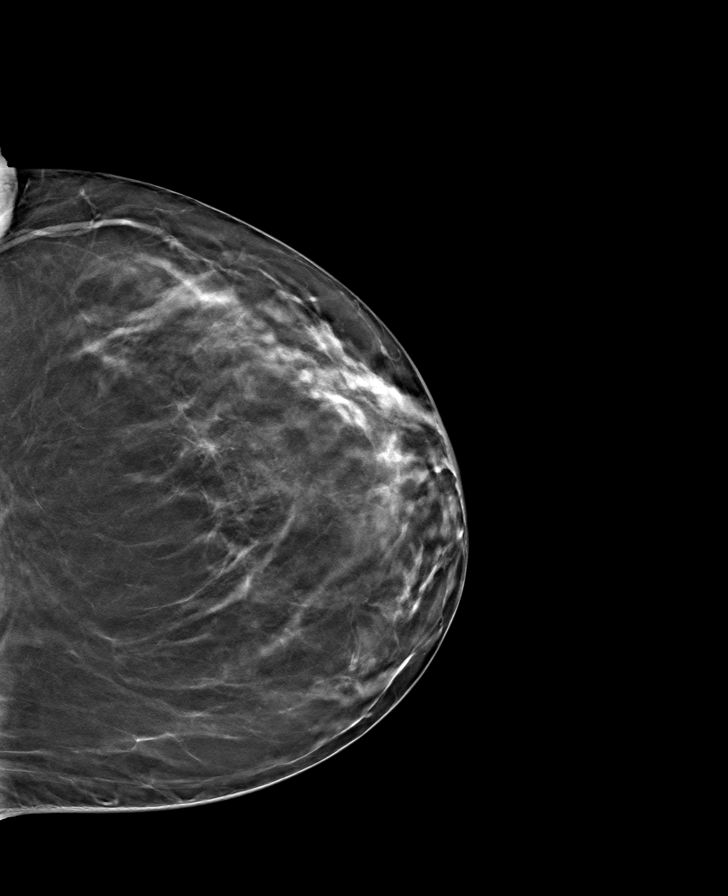

[8 of 24 positions shown; findings below may reference images not displayed]

ACR Breast Density Category b: There are scattered areas of
fibroglandular density.
FINDINGS: There are no findings suspicious for malignancy. Images were
processed with CAD.
IMPRESSION: No mammographic evidence of malignancy. A result letter of this
screening mammogram will be mailed directly to the patient.

RECOMMENDATION:
Screening mammogram in one year. (Code:CN-U-775)

BI-RADS CATEGORY  1: Negative.

## 2019-05-27 ENCOUNTER — Other Ambulatory Visit: Payer: Self-pay | Admitting: Family

## 2019-05-27 DIAGNOSIS — R109 Unspecified abdominal pain: Secondary | ICD-10-CM

## 2019-06-24 ENCOUNTER — Ambulatory Visit
Admission: RE | Admit: 2019-06-24 | Discharge: 2019-06-24 | Disposition: A | Discharge status: Home / Self Care | Payer: Medicare PPO | Source: Ambulatory Visit | Attending: Family | Admitting: Family

## 2019-06-24 DIAGNOSIS — R109 Unspecified abdominal pain: Secondary | ICD-10-CM

## 2019-08-05 ENCOUNTER — Other Ambulatory Visit: Payer: Self-pay | Admitting: Family

## 2019-08-05 DIAGNOSIS — Z1231 Encounter for screening mammogram for malignant neoplasm of breast: Secondary | ICD-10-CM

## 2019-12-25 ENCOUNTER — Ambulatory Visit
Admission: RE | Admit: 2019-12-25 | Discharge: 2019-12-25 | Disposition: A | Discharge status: Home / Self Care | Payer: Medicare PPO | Source: Ambulatory Visit | Attending: Family | Admitting: Family

## 2019-12-25 ENCOUNTER — Other Ambulatory Visit: Payer: Self-pay

## 2019-12-25 DIAGNOSIS — Z1231 Encounter for screening mammogram for malignant neoplasm of breast: Secondary | ICD-10-CM

## 2019-12-31 ENCOUNTER — Other Ambulatory Visit: Payer: Self-pay | Admitting: Family

## 2019-12-31 DIAGNOSIS — R928 Other abnormal and inconclusive findings on diagnostic imaging of breast: Secondary | ICD-10-CM

## 2020-03-31 ENCOUNTER — Telehealth: Payer: Medicare PPO

## 2020-03-31 NOTE — Progress Notes (Unsigned)
Pt states she is having arthritis pain

## 2020-04-09 ENCOUNTER — Ambulatory Visit: Payer: Medicare PPO | Admitting: Internal Medicine

## 2021-10-30 LAB — EXTERNAL GENERIC LAB PROCEDURE: COLOGUARD: NEGATIVE

## 2021-10-30 LAB — COLOGUARD: COLOGUARD: NEGATIVE

## 2022-11-08 ENCOUNTER — Other Ambulatory Visit: Payer: Self-pay | Admitting: Physician Assistant

## 2022-11-08 DIAGNOSIS — Z13820 Encounter for screening for osteoporosis: Secondary | ICD-10-CM
